# Patient Record
Sex: Female | Born: 1950 | Race: White | Hispanic: No | Smoking: Never smoker
Health system: Southern US, Community
[De-identification: ages and names within clinical notes are randomized; demographics above are authoritative.]

## PROBLEM LIST (undated history)

## (undated) DIAGNOSIS — N809 Endometriosis, unspecified: Secondary | ICD-10-CM

## (undated) DIAGNOSIS — R7303 Prediabetes: Secondary | ICD-10-CM

## (undated) HISTORY — PX: TRIGGER FINGER RELEASE: SHX641

## (undated) HISTORY — PX: ABLATION ON ENDOMETRIOSIS: SHX5787

---

## 1998-01-30 ENCOUNTER — Other Ambulatory Visit: Admission: RE | Admit: 1998-01-30 | Discharge: 1998-01-30 | Payer: Self-pay | Admitting: *Deleted

## 1998-09-02 ENCOUNTER — Ambulatory Visit (HOSPITAL_COMMUNITY): Admission: RE | Admit: 1998-09-02 | Discharge: 1998-09-02 | Payer: Self-pay | Admitting: *Deleted

## 2000-01-12 ENCOUNTER — Other Ambulatory Visit: Admission: RE | Admit: 2000-01-12 | Discharge: 2000-01-12 | Payer: Self-pay | Admitting: *Deleted

## 2001-03-02 ENCOUNTER — Other Ambulatory Visit: Admission: RE | Admit: 2001-03-02 | Discharge: 2001-03-02 | Payer: Self-pay | Admitting: *Deleted

## 2002-04-06 ENCOUNTER — Other Ambulatory Visit: Admission: RE | Admit: 2002-04-06 | Discharge: 2002-04-06 | Payer: Self-pay | Admitting: *Deleted

## 2003-04-09 ENCOUNTER — Other Ambulatory Visit: Admission: RE | Admit: 2003-04-09 | Discharge: 2003-04-09 | Payer: Self-pay | Admitting: *Deleted

## 2004-04-08 ENCOUNTER — Other Ambulatory Visit: Admission: RE | Admit: 2004-04-08 | Discharge: 2004-04-08 | Payer: Self-pay | Admitting: *Deleted

## 2006-03-11 ENCOUNTER — Other Ambulatory Visit: Admission: RE | Admit: 2006-03-11 | Discharge: 2006-03-11 | Payer: Self-pay | Admitting: *Deleted

## 2007-06-30 ENCOUNTER — Other Ambulatory Visit: Admission: RE | Admit: 2007-06-30 | Discharge: 2007-06-30 | Payer: Self-pay | Admitting: *Deleted

## 2008-07-10 ENCOUNTER — Other Ambulatory Visit: Admission: RE | Admit: 2008-07-10 | Discharge: 2008-07-10 | Payer: Self-pay | Admitting: Gynecology

## 2009-02-28 ENCOUNTER — Encounter: Admission: RE | Admit: 2009-02-28 | Discharge: 2009-04-02 | Payer: Self-pay | Admitting: Sports Medicine

## 2018-04-09 ENCOUNTER — Emergency Department: Payer: BLUE CROSS/BLUE SHIELD

## 2018-04-09 ENCOUNTER — Other Ambulatory Visit: Payer: Self-pay

## 2018-04-09 ENCOUNTER — Emergency Department
Admission: EM | Admit: 2018-04-09 | Discharge: 2018-04-09 | Disposition: A | Discharge status: Home / Self Care | Payer: BLUE CROSS/BLUE SHIELD | Attending: Emergency Medicine | Admitting: Emergency Medicine

## 2018-04-09 ENCOUNTER — Encounter: Payer: Self-pay | Admitting: Emergency Medicine

## 2018-04-09 DIAGNOSIS — Z87891 Personal history of nicotine dependence: Secondary | ICD-10-CM | POA: Insufficient documentation

## 2018-04-09 DIAGNOSIS — Z7982 Long term (current) use of aspirin: Secondary | ICD-10-CM | POA: Insufficient documentation

## 2018-04-09 DIAGNOSIS — M25552 Pain in left hip: Secondary | ICD-10-CM | POA: Insufficient documentation

## 2018-04-09 MED ORDER — MELOXICAM 15 MG PO TABS: 15.0000 mg | ORAL_TABLET | Freq: Every day | ORAL | 1 refills | Status: AC
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
## 2020-05-20 ENCOUNTER — Other Ambulatory Visit: Payer: Self-pay

## 2020-05-20 ENCOUNTER — Emergency Department: Payer: 59

## 2020-05-20 ENCOUNTER — Emergency Department
Admission: EM | Admit: 2020-05-20 | Discharge: 2020-05-20 | Disposition: A | Discharge status: Home / Self Care | Payer: 59 | Attending: Emergency Medicine | Admitting: Emergency Medicine

## 2020-05-20 ENCOUNTER — Encounter: Payer: Self-pay | Admitting: Emergency Medicine

## 2020-05-20 DIAGNOSIS — R197 Diarrhea, unspecified: Secondary | ICD-10-CM | POA: Diagnosis present

## 2020-05-20 DIAGNOSIS — Z7982 Long term (current) use of aspirin: Secondary | ICD-10-CM | POA: Insufficient documentation

## 2020-05-20 DIAGNOSIS — U071 COVID-19: Secondary | ICD-10-CM | POA: Diagnosis not present

## 2020-05-20 DIAGNOSIS — E860 Dehydration: Secondary | ICD-10-CM

## 2020-05-20 HISTORY — DX: Prediabetes: R73.03

## 2020-05-20 HISTORY — DX: Endometriosis, unspecified: N80.9

## 2020-05-20 LAB — COMPREHENSIVE METABOLIC PANEL
ALT: 23 U/L (ref 0–44)
AST: 34 U/L (ref 15–41)
Albumin: 4.1 g/dL (ref 3.5–5.0)
Alkaline Phosphatase: 55 U/L (ref 38–126)
Anion gap: 13 (ref 5–15)
BUN: 20 mg/dL (ref 8–23)
CO2: 24 mmol/L (ref 22–32)
Calcium: 9.2 mg/dL (ref 8.9–10.3)
Chloride: 102 mmol/L (ref 98–111)
Creatinine, Ser: 1.04 mg/dL — ABNORMAL HIGH (ref 0.44–1.00)
GFR calc Af Amer: >60 mL/min (ref >60)
GFR calc non Af Amer: 56 mL/min — ABNORMAL LOW (ref >60)
Glucose, Bld: 120 mg/dL — ABNORMAL HIGH (ref 70–99)
Potassium: 3.8 mmol/L (ref 3.5–5.1)
Sodium: 139 mmol/L (ref 135–145)
Total Bilirubin: 0.7 mg/dL (ref 0.3–1.2)
Total Protein: 7.8 g/dL (ref 6.5–8.1)

## 2020-05-20 LAB — URINALYSIS, COMPLETE (UACMP) WITH MICROSCOPIC
Bacteria, UA: NONE SEEN
Bilirubin Urine: NEGATIVE
Glucose, UA: NEGATIVE mg/dL
Hgb urine dipstick: NEGATIVE
Ketones, ur: 5 mg/dL — AB
Leukocytes,Ua: NEGATIVE
Nitrite: NEGATIVE
Protein, ur: 30 mg/dL — AB
Specific Gravity, Urine: 1.024 (ref 1.005–1.030)
pH: 5 (ref 5.0–8.0)

## 2020-05-20 LAB — CBC
HCT: 46.6 % — ABNORMAL HIGH (ref 36.0–46.0)
Hemoglobin: 15.7 g/dL — ABNORMAL HIGH (ref 12.0–15.0)
MCH: 28.1 pg (ref 26.0–34.0)
MCHC: 33.7 g/dL (ref 30.0–36.0)
MCV: 83.5 fL (ref 80.0–100.0)
Platelets: 157 10*3/uL (ref 150–400)
RBC: 5.58 MIL/uL — ABNORMAL HIGH (ref 3.87–5.11)
RDW: 14.2 % (ref 11.5–15.5)
WBC: 6.1 10*3/uL (ref 4.0–10.5)
nRBC: 0 % (ref 0.0–0.2)

## 2020-05-20 LAB — LIPASE, BLOOD: Lipase: 44 U/L (ref 11–51)

## 2020-05-20 LAB — SARS CORONAVIRUS 2 BY RT PCR (HOSPITAL ORDER, PERFORMED IN ~~LOC~~ HOSPITAL LAB): SARS Coronavirus 2: POSITIVE — AB

## 2020-05-20 MED ORDER — ONDANSETRON HCL 4 MG/2ML IJ SOLN
4.0000 mg | Freq: Once | INTRAMUSCULAR | Status: AC
  Administered 2020-05-20: 4 mg via INTRAVENOUS

## 2020-05-20 MED ORDER — SODIUM CHLORIDE 0.9 % IV BOLUS
1000.0000 mL | Freq: Once | INTRAVENOUS | Status: AC
  Administered 2020-05-20: 1000 mL via INTRAVENOUS

## 2020-05-20 MED ORDER — ONDANSETRON 4 MG PO TBDP: 4.0000 mg | ORAL_TABLET | Freq: Three times a day (TID) | ORAL | 0 refills | Status: AC | PRN

## 2020-05-20 NOTE — ED Provider Notes (Signed)
[facility] Emergency Department Provider Note  ____________________________________________   First MD Initiated Contact with Patient 05/20/20 1238     (approximate)  I have reviewed the triage vital signs and the nursing notes.   HISTORY  Chief Complaint Emesis and Diarrhea    HPI Sabrina Vega is a 69 y.o. female presents emergency department complaining of fever, cough, vomiting diarrhea since Saturday.  Patient is not immunized for Covid.  She states that she is afraid the diarrhea will run down her legs.  However she has not had any vomiting or diarrhea since she has been here in the ED.  She denies abdominal pain other than cramping with diarrhea.  Patient states she is prediabetic and has an appointment with her doctor.  She denies chest pain or shortness of breath at this time    Past Medical History:  Diagnosis Date  . Endometriosis   . Prediabetes     There are no problems to display for this patient.   Past Surgical History:  Procedure Laterality Date  . ABLATION ON ENDOMETRIOSIS    . TRIGGER FINGER RELEASE Left     Prior to Admission medications   Medication Sig Start Date End Date Taking? Authorizing Provider  aspirin 81 MG chewable tablet Chew 81 mg by mouth daily.    [provider]  ondansetron (ZOFRAN-ODT) 4 MG disintegrating tablet Take 1 tablet (4 mg total) by mouth every 8 (eight) hours as needed. 05/20/20   Faythe Ghee, PA-C    Allergies Penicillins and Sulfa antibiotics  No family history on file.  Social History Social History   Tobacco Use  . Smoking status: Never Smoker  . Smokeless tobacco: Never Used  Substance Use Topics  . Alcohol use: Not Currently  . Drug use: Not Currently    Review of Systems  Constitutional: Positive fever/chills Eyes: No visual changes. ENT: No sore throat. Respiratory: Positive cough Cardiovascular: Denies chest pain Gastrointestinal: Denies abdominal pain,  positive nausea vomiting diarrhea Genitourinary: Negative for dysuria. Musculoskeletal: Negative for back pain. Skin: Negative for rash. Psychiatric: no mood changes,     ____________________________________________   PHYSICAL EXAM:  VITAL SIGNS: ED Triage Vitals  Enc Vitals Group     BP 05/20/20 1101 117/69     Pulse Rate 05/20/20 1101 (!) 101     Resp 05/20/20 1101 19     Temp 05/20/20 1101 98.9 F (37.2 C)     Temp Source 05/20/20 1101 Oral     SpO2 05/20/20 1101 100 %     Weight 05/20/20 1110 145 lb (65.8 kg)     Height 05/20/20 1110 5\' 3"  (1.6 m)     Head Circumference --      Peak Flow --      Pain Score 05/20/20 1110 0     Pain Loc --      Pain Edu? --      Excl. in GC? --     Constitutional: Alert and oriented. Well appearing and in no acute distress. Eyes: Conjunctivae are normal.  Head: Atraumatic. Nose: No congestion/rhinnorhea. Mouth/Throat: Mucous membranes are moist.   Neck:  supple no lymphadenopathy noted Cardiovascular: Normal rate, regular rhythm. Heart sounds are normal Respiratory: Normal respiratory effort.  No retractions, lungs c t a, patient has a dry cough Abd: soft nontender bs normal all 4 quad GU: deferred Musculoskeletal: FROM all extremities, warm and well perfused Neurologic:  Normal speech and language.  Skin:  Skin is warm,  dry and intact. No rash noted. Psychiatric: Mood and affect are normal. Speech and behavior are normal.  ____________________________________________   LABS (all labs ordered are listed, but only abnormal results are displayed)  Labs Reviewed  SARS CORONAVIRUS 2 BY RT PCR (HOSPITAL ORDER, PERFORMED IN [facility]) - Abnormal; Notable for the following components:      Result Value   SARS Coronavirus 2 POSITIVE (*)    All other components within normal limits  COMPREHENSIVE METABOLIC PANEL - Abnormal; Notable for the following components:   Glucose, Bld 120 (*)    Creatinine, Ser 1.04 (*)     GFR calc non Af Amer 56 (*)    All other components within normal limits  CBC - Abnormal; Notable for the following components:   RBC 5.58 (*)    Hemoglobin 15.7 (*)    HCT 46.6 (*)    All other components within normal limits  URINALYSIS, COMPLETE (UACMP) WITH MICROSCOPIC - Abnormal; Notable for the following components:   Color, Urine AMBER (*)    APPearance CLOUDY (*)    Ketones, ur 5 (*)    Protein, ur 30 (*)    All other components within normal limits  LIPASE, BLOOD   ____________________________________________   ____________________________________________  RADIOLOGY  Chest x-ray is normal  ____________________________________________   PROCEDURES  Procedure(s) performed: No  Procedures    ____________________________________________   INITIAL IMPRESSION / ASSESSMENT AND PLAN / ED COURSE  Pertinent labs & imaging results that were available during my care of the patient were reviewed by me and considered in my medical decision making (see chart for details).   Patient is a 69 year old female who is unvaccinated for Covid presents emergency department Covid-like symptoms.  See HPI  Physical exam shows patient to appear stable.  Exam is unremarkable other than a dry cough  DDx: Covid, gastroenteritis, community-acquired pneumonia  CBC has elevated H&H most likely due to dehydration, comprehensive metabolic panel has elevated glucose of 120, creatinine is 1.04, however the patient's BUN is normal, lipase is normal, urinalysis is not collected at this time as patient's been unable to urinate, Covid swab pending  Covid test is positive. Gave the patient 1 L normal saline here in the ED.  She is to follow-up with the infusion center to see if she qualifies for remdesivir infusions.  She states she understands.  She is to quarantine for 14 days.  Is discharged stable condition.    Sabrina Vega was evaluated in Emergency Department on 05/20/2020 for the symptoms  described in the history of present illness. She was evaluated in the context of the global COVID-19 pandemic, which necessitated consideration that the patient might be at risk for infection with the SARS-CoV-2 virus that causes COVID-19. Institutional protocols and algorithms that pertain to the evaluation of patients at risk for COVID-19 are in a state of rapid change based on information released by regulatory bodies including the CDC and federal and state organizations. These policies and algorithms were followed during the patient's care in the ED.    As part of my medical decision making, I reviewed the following data within the electronic MEDICAL RECORD NUMBER Nursing notes reviewed and incorporated, Labs reviewed , Old chart reviewed, Radiograph reviewed , Notes from prior ED visits and [place] Controlled Substance Database  ____________________________________________   FINAL CLINICAL IMPRESSION(S) / ED DIAGNOSES  Final diagnoses:  Dehydration  COVID-19      NEW MEDICATIONS STARTED DURING THIS VISIT:  Discharge Medication  List as of 05/20/2020  3:56 PM    START taking these medications   Details  ondansetron (ZOFRAN-ODT) 4 MG disintegrating tablet Take 1 tablet (4 mg total) by mouth every 8 (eight) hours as needed., Starting Mon 05/20/2020, Print         Note:  This document was prepared using Dragon voice recognition software and may include unintentional dictation errors.    Faythe Ghee, PA-C 05/20/20 Lacie Scotts, MD 05/21/20 [phone]

## 2020-05-20 NOTE — ED Triage Notes (Signed)
Pt presents to ED via POV with c/o N/V/D. Pt states over the weekend had "constant nausea, vomiting, and diarrhea". Pt states today no vomiting, c/o nausea, states has had several episodes of diarrhea. Pt states is able to tolerate PO at this time but feels like "it comes right out".

## 2020-05-20 NOTE — Discharge Instructions (Signed)
Follow-up with your regular doctor as needed. Follow-up with the Covid infusion clinic to receive remdesivir infusions.  Please call to see if you qualify. Take over-the-counter Imodium A-D as needed for diarrhea Zofran for vomiting as needed

## 2020-05-20 NOTE — ED Notes (Addendum)
Pt ambulatory from lobby with steady gait. Pt reports fever, cough, vomiting and diarrhea since Saturday. Pt in NAD, skin warm and dry.

## 2020-05-26 ENCOUNTER — Emergency Department (HOSPITAL_COMMUNITY): Payer: 59

## 2020-05-26 ENCOUNTER — Encounter (HOSPITAL_COMMUNITY): Payer: Self-pay | Admitting: *Deleted

## 2020-05-26 ENCOUNTER — Other Ambulatory Visit: Payer: Self-pay

## 2020-05-26 ENCOUNTER — Emergency Department (HOSPITAL_COMMUNITY)
Admission: EM | Admit: 2020-05-26 | Discharge: 2020-05-27 | Disposition: A | Discharge status: Home / Self Care | Payer: 59 | Attending: Emergency Medicine | Admitting: Emergency Medicine

## 2020-05-26 DIAGNOSIS — R0602 Shortness of breath: Secondary | ICD-10-CM | POA: Diagnosis present

## 2020-05-26 DIAGNOSIS — Z7982 Long term (current) use of aspirin: Secondary | ICD-10-CM | POA: Insufficient documentation

## 2020-05-26 DIAGNOSIS — U071 COVID-19: Secondary | ICD-10-CM | POA: Insufficient documentation

## 2020-05-26 LAB — BASIC METABOLIC PANEL
Anion gap: 13 (ref 5–15)
BUN: 12 mg/dL (ref 8–23)
CO2: 23 mmol/L (ref 22–32)
Calcium: 8.5 mg/dL — ABNORMAL LOW (ref 8.9–10.3)
Chloride: 99 mmol/L (ref 98–111)
Creatinine, Ser: 0.74 mg/dL (ref 0.44–1.00)
GFR calc Af Amer: >60 mL/min (ref >60)
GFR calc non Af Amer: >60 mL/min (ref >60)
Glucose, Bld: 194 mg/dL — ABNORMAL HIGH (ref 70–99)
Potassium: 3.5 mmol/L (ref 3.5–5.1)
Sodium: 135 mmol/L (ref 135–145)

## 2020-05-26 LAB — CBC
HCT: 43 % (ref 36.0–46.0)
Hemoglobin: 14 g/dL (ref 12.0–15.0)
MCH: 27.5 pg (ref 26.0–34.0)
MCHC: 32.6 g/dL (ref 30.0–36.0)
MCV: 84.5 fL (ref 80.0–100.0)
Platelets: 243 10*3/uL (ref 150–400)
RBC: 5.09 MIL/uL (ref 3.87–5.11)
RDW: 13.2 % (ref 11.5–15.5)
WBC: 7.1 10*3/uL (ref 4.0–10.5)
nRBC: 0 % (ref 0.0–0.2)

## 2020-05-26 NOTE — ED Triage Notes (Signed)
Pt brought in by ems due to intermittent sob.  Pt was dx with covid on Monday.  Spo2 94% on RA for ems.

## 2020-05-26 NOTE — ED Triage Notes (Signed)
Tested positive for covid Monday.  Pt is unvaccinated.  Pt continues to feel unwell

## 2020-05-27 MED ORDER — FAMOTIDINE IN NACL 20-0.9 MG/50ML-% IV SOLN: 20.0000 mg | Freq: Once | INTRAVENOUS | Status: DC | PRN

## 2020-05-27 MED ORDER — SODIUM CHLORIDE 0.9 % IV SOLN: INTRAVENOUS | Status: DC | PRN

## 2020-05-27 MED ORDER — EPINEPHRINE 0.3 MG/0.3ML IJ SOAJ: 0.3000 mg | Freq: Once | INTRAMUSCULAR | Status: DC | PRN

## 2020-05-27 MED ORDER — DIPHENHYDRAMINE HCL 50 MG/ML IJ SOLN: 50.0000 mg | Freq: Once | INTRAMUSCULAR | Status: DC | PRN

## 2020-05-27 MED ORDER — SODIUM CHLORIDE 0.9 % IV SOLN
1200.0000 mg | Freq: Once | INTRAVENOUS | Status: AC
  Administered 2020-05-27: 1200 mg via INTRAVENOUS
  Filled 2020-05-27: qty 1200

## 2020-05-27 MED ORDER — ACETAMINOPHEN 325 MG PO TABS
650.0000 mg | ORAL_TABLET | Freq: Once | ORAL | Status: AC
  Administered 2020-05-27: 650 mg via ORAL
  Filled 2020-05-27: qty 2

## 2020-05-27 MED ORDER — METHYLPREDNISOLONE SODIUM SUCC 125 MG IJ SOLR: 125.0000 mg | Freq: Once | INTRAMUSCULAR | Status: DC | PRN

## 2020-05-27 MED ORDER — ALBUTEROL SULFATE HFA 108 (90 BASE) MCG/ACT IN AERS: 2.0000 | INHALATION_SPRAY | Freq: Once | RESPIRATORY_TRACT | Status: DC | PRN

## 2020-05-27 NOTE — ED Notes (Signed)
Patient ambulated with cane with pulse oximetry lowest 93% RA but maintained above 95% on RA.

## 2020-05-27 NOTE — Discharge Instructions (Signed)
Take tylenol for fever, headaches, body aches. Drink plenty of fluids to prevent dehydration. You may continue to use other over-the-counter remedies for symptom control, if desired. Return for new or concerning symptoms such as worsening shortness of breath, coughing up blood, persistent vomiting, loss of consciousness.

## 2020-05-27 NOTE — ED Provider Notes (Addendum)
[facility] EMERGENCY DEPARTMENT Provider Note   CSN: [id number] Arrival date & time: 05/26/20  1520     History Chief Complaint  Patient presents with   Shortness of Breath    Sabrina Vega is a 69 y.o. female.  Sabrina Vega is a 69 year old female with a PMH of prediabetes, arthritis, and glaucoma who presents with a chief complaint of SOB and general malaise. She was seen at [facility] on 05/20/20 with complaints of fever, cough, vomiting, and diarrhea and was positive for COVID-19. Her symptoms started on 05/18/20. Patient states that she is feeling worse since being seen at [facility]. She continues to have a productive cough with white sputum but denies hemoptysis. She has developed SOB that is worse when moving or trying to get up and use the bathroom. Patient endorses continued vomiting and diarrhea. She has vomited once since being seen at [facility] and denies using prescribed Zofran because it makes her feel dry. She has diarrhea 2-3 times a day without fecal incontinence and is taking one Imodium daily. She is drinking fluids including water, sprite, and Gatorade but has had little appetite. Patient has not been vaccinated for COVID-19.  The history is provided by the patient. No language interpreter was used.       Past Medical History:  Diagnosis Date   Endometriosis    Prediabetes     There are no problems to display for this patient.   Past Surgical History:  Procedure Laterality Date   ABLATION ON ENDOMETRIOSIS     TRIGGER FINGER RELEASE Left      OB History   No obstetric history on file.     No family history on file.  Social History   Tobacco Use   Smoking status: Never Smoker   Smokeless tobacco: Never Used  Substance Use Topics   Alcohol use: Not Currently   Drug use: Not Currently    Home Medications Prior to Admission medications   Medication Sig Start Date End Date Taking? Authorizing Provider  aspirin 81 MG chewable tablet  Chew 81 mg by mouth daily.    [provider]  ondansetron (ZOFRAN-ODT) 4 MG disintegrating tablet Take 1 tablet (4 mg total) by mouth every 8 (eight) hours as needed. 05/20/20   Faythe Ghee, PA-C    Allergies    Penicillins and Sulfa antibiotics  Review of Systems   Review of Systems  Ten systems reviewed and are negative for acute change, except as noted in the HPI.    Physical Exam Updated Vital Signs BP 140/62    Pulse 77    Temp 99.3 F (37.4 C) (Oral)    Resp (!) 28    SpO2 94%   Physical Exam Vitals and nursing note reviewed.  Constitutional:      General: She is not in acute distress.    Appearance: She is well-developed. She is not diaphoretic.     Comments: Nontoxic appearing  HENT:     Head: Normocephalic and atraumatic.  Eyes:     General: No scleral icterus.    Conjunctiva/sclera: Conjunctivae normal.  Cardiovascular:     Rate and Rhythm: Normal rate and regular rhythm.     Pulses: Normal pulses.  Pulmonary:     Effort: Pulmonary effort is normal. No respiratory distress.     Comments: Respirations even and unlabored. SpO2 97% on room air. Musculoskeletal:        General: Normal range of motion.  Cervical back: Normal range of motion.  Skin:    General: Skin is warm and dry.     Coloration: Skin is not pale.     Findings: No erythema or rash.  Neurological:     Mental Status: She is alert and oriented to person, place, and time.  Psychiatric:        Behavior: Behavior normal.     ED Results / Procedures / Treatments   Labs (all labs ordered are listed, but only abnormal results are displayed) Labs Reviewed  BASIC METABOLIC PANEL - Abnormal; Notable for the following components:      Result Value   Glucose, Bld 194 (*)    Calcium 8.5 (*)    All other components within normal limits  CBC    EKG EKG Interpretation  Date/Time:  Monday May 27 2020 00:18:25 EDT Ventricular Rate:  93 PR Interval:  156 QRS Duration: 90 QT  Interval:  339 QTC Calculation: 422 R Axis:   -36 Text Interpretation: Sinus rhythm Left axis deviation Anterolateral infarct, age indeterminate no STEMI Confirmed by Glynn Octave [phone]) on 05/27/2020 12:24:12 AM   Radiology DG Chest Portable 1 View  Result Date: 05/26/2020 CLINICAL DATA:  COVID. EXAM: PORTABLE CHEST 1 VIEW COMPARISON:  Radiograph 05/20/2020 FINDINGS: Lower lung volumes from prior exam. Patchy heterogeneous bilateral airspace opacities in a mid-lower lung zone predominant distribution. Normal heart size and mediastinal contours. No pleural effusion or pneumothorax. External artifact from clothing. IMPRESSION: Patchy heterogeneous bilateral airspace opacities, consistent with COVID-19 pneumonia. Progression from exam 6 days ago. Electronically Signed   By: Narda Rutherford M.D.   On: 05/26/2020 20:31    Procedures .Critical Care Performed by: Antony Madura, PA-C Authorized by: Antony Madura, PA-C   Critical care provider statement:    Critical care time (minutes):  45   Critical care was time spent personally by me on the following activities:  Discussions with consultants, evaluation of patient's response to treatment, examination of patient, ordering and performing treatments and interventions, ordering and review of laboratory studies, ordering and review of radiographic studies, pulse oximetry, re-evaluation of patient's condition, obtaining history from patient or surrogate and review of old charts   (including critical care time)  Medications Ordered in ED Medications  0.9 %  sodium chloride infusion (has no administration in time range)  diphenhydrAMINE (BENADRYL) injection 50 mg (has no administration in time range)  famotidine (PEPCID) IVPB 20 mg premix (has no administration in time range)  methylPREDNISolone sodium succinate (SOLU-MEDROL) 125 mg/2 mL injection 125 mg (has no administration in time range)  albuterol (VENTOLIN HFA) 108 (90 Base) MCG/ACT inhaler  2 puff (has no administration in time range)  EPINEPHrine (EPI-PEN) injection 0.3 mg (has no administration in time range)  acetaminophen (TYLENOL) tablet 650 mg (650 mg Oral Given 05/27/20 0037)  casirivimab-imdevimab (REGEN-COV) 1,200 mg in sodium chloride 0.9 % 110 mL IVPB (0 mg Intravenous Stopped 05/27/20 0252)    ED Course  I have reviewed the triage vital signs and the nursing notes.  Pertinent labs & imaging results that were available during my care of the patient were reviewed by me and considered in my medical decision making (see chart for details).  Clinical Course as of May 27 410  Mon May 27, 2020  4008 Patient ambulated in the hallway with RN. Sats dropped to 93% for a second, but otherwise maintained at or above 95% on room air.   [KH]  0205 Discussed option for monoclonal antibody infusion  with patient given older age and mild symptoms.  She has agreed to proceed with infusion today.  Anticipate discharge once infusion completed if no adverse reaction or clinical decompensation.   [KH]  0327 Infusion completed. VSS. If no change by 0400, patient appropriate for discharge.   [KH]  0411 Patient sleeping on reassessment. Feels fine when awake. VSS. Discharged with return precautions.   [KH]    Clinical Course User Index [KH] Darylene Price   MDM Rules/Calculators/A&P                          69 year old female presenting to the emergency department with complaints of persistent symptoms of COVID-19 infection.  Initially diagnosed 1 week ago at [facility]  While evidence of slight disease progression on chest x-ray, labs reassuring.  Vital stable and no hypoxia with ambulation.  Given age, prediabetes, patient given the option to proceed with monoclonal antibody infusion.  She tolerated this treatment well without clinical decompensation.  Have provided information on continued outpatient supportive care.  Encouraged follow-up with a primary doctor.  Return precautions  discussed and provided. Patient discharged in stable condition with no unaddressed concerns.  Sabrina Vega was evaluated in Emergency Department on 05/27/2020 for the symptoms described in the history of present illness. She was evaluated in the context of the global COVID-19 pandemic, which necessitated consideration that the patient might be at risk for infection with the SARS-CoV-2 virus that causes COVID-19. Institutional protocols and algorithms that pertain to the evaluation of patients at risk for COVID-19 are in a state of rapid change based on information released by regulatory bodies including the CDC and federal and state organizations. These policies and algorithms were followed during the patient's care in the ED.   Final Clinical Impression(s) / ED Diagnoses Final diagnoses:  COVID-19 virus infection    Rx / DC Orders ED Discharge Orders    None       Antony Madura, PA-C 05/27/20 0413    Antony Madura, PA-C 05/27/20 0414    Glynn Octave, MD 05/31/20 [phone]

## 2020-08-09 ENCOUNTER — Other Ambulatory Visit: Payer: Self-pay | Admitting: Obstetrics & Gynecology

## 2020-08-09 ENCOUNTER — Other Ambulatory Visit: Payer: Self-pay | Admitting: Diagnostic Radiology

## 2020-08-09 DIAGNOSIS — N63 Unspecified lump in unspecified breast: Secondary | ICD-10-CM

## 2020-08-27 ENCOUNTER — Other Ambulatory Visit: Payer: Self-pay | Admitting: Internal Medicine

## 2020-08-27 ENCOUNTER — Ambulatory Visit: Payer: 59 | Attending: Internal Medicine

## 2020-08-27 DIAGNOSIS — Z23 Encounter for immunization: Secondary | ICD-10-CM

## 2020-08-27 NOTE — Progress Notes (Signed)
   Covid-19 Vaccination Clinic  Name:  Sabrina Vega    MRN: [id number] DOB: [birth year]  08/27/2020  [name] was observed post Covid-19 immunization for 15 minutes without incident. She was provided with Vaccine Information Sheet and instruction to access the V-Safe system.   [name] was instructed to call 911 with any severe reactions post vaccine: Marland Kitchen Difficulty breathing  . Swelling of face and throat  . A fast heartbeat  . A bad rash all over body  . Dizziness and weakness   Immunizations Administered    Name Date Dose VIS Date Route   Pfizer COVID-19 Vaccine 08/27/2020  9:06 AM 0.3 mL 07/17/2020 Intramuscular   Manufacturer: [employer], [employer]   Lot: [id number]   NDC: [address]-1

## 2020-09-17 ENCOUNTER — Ambulatory Visit: Payer: 59 | Attending: Internal Medicine

## 2020-09-17 ENCOUNTER — Other Ambulatory Visit: Payer: Self-pay | Admitting: Internal Medicine

## 2020-09-17 DIAGNOSIS — Z23 Encounter for immunization: Secondary | ICD-10-CM

## 2020-09-17 NOTE — Progress Notes (Signed)
   Covid-19 Vaccination Clinic  Name:  NORENA BRATTON    MRN: [id number] DOB: [birth year]  09/17/2020  Ms. Mecham was observed post Covid-19 immunization for 15 minutes without incident. She was provided with Vaccine Information Sheet and instruction to access the V-Safe system.   Ms. Daza was instructed to call 911 with any severe reactions post vaccine: Marland Kitchen Difficulty breathing  . Swelling of face and throat  . A fast heartbeat  . A bad rash all over body  . Dizziness and weakness   Immunizations Administered    Name Date Dose VIS Date Route   Pfizer COVID-19 Vaccine 09/17/2020  3:00 PM 0.3 mL 07/17/2020 Intramuscular   Manufacturer: [employer], [employer]   Lot: [id number]   NDC: [address]-6

## 2021-10-02 ENCOUNTER — Ambulatory Visit: Payer: 59 | Admitting: Dermatology

## 2022-06-01 IMAGING — DX DG CHEST 1V PORT
1 series · 1 of 1 positions shown · non-contrast
Comparison: Radiograph 05/20/2020

CLINICAL DATA: COVID.

EXAM:
PORTABLE CHEST 1 VIEW

[chest ap]
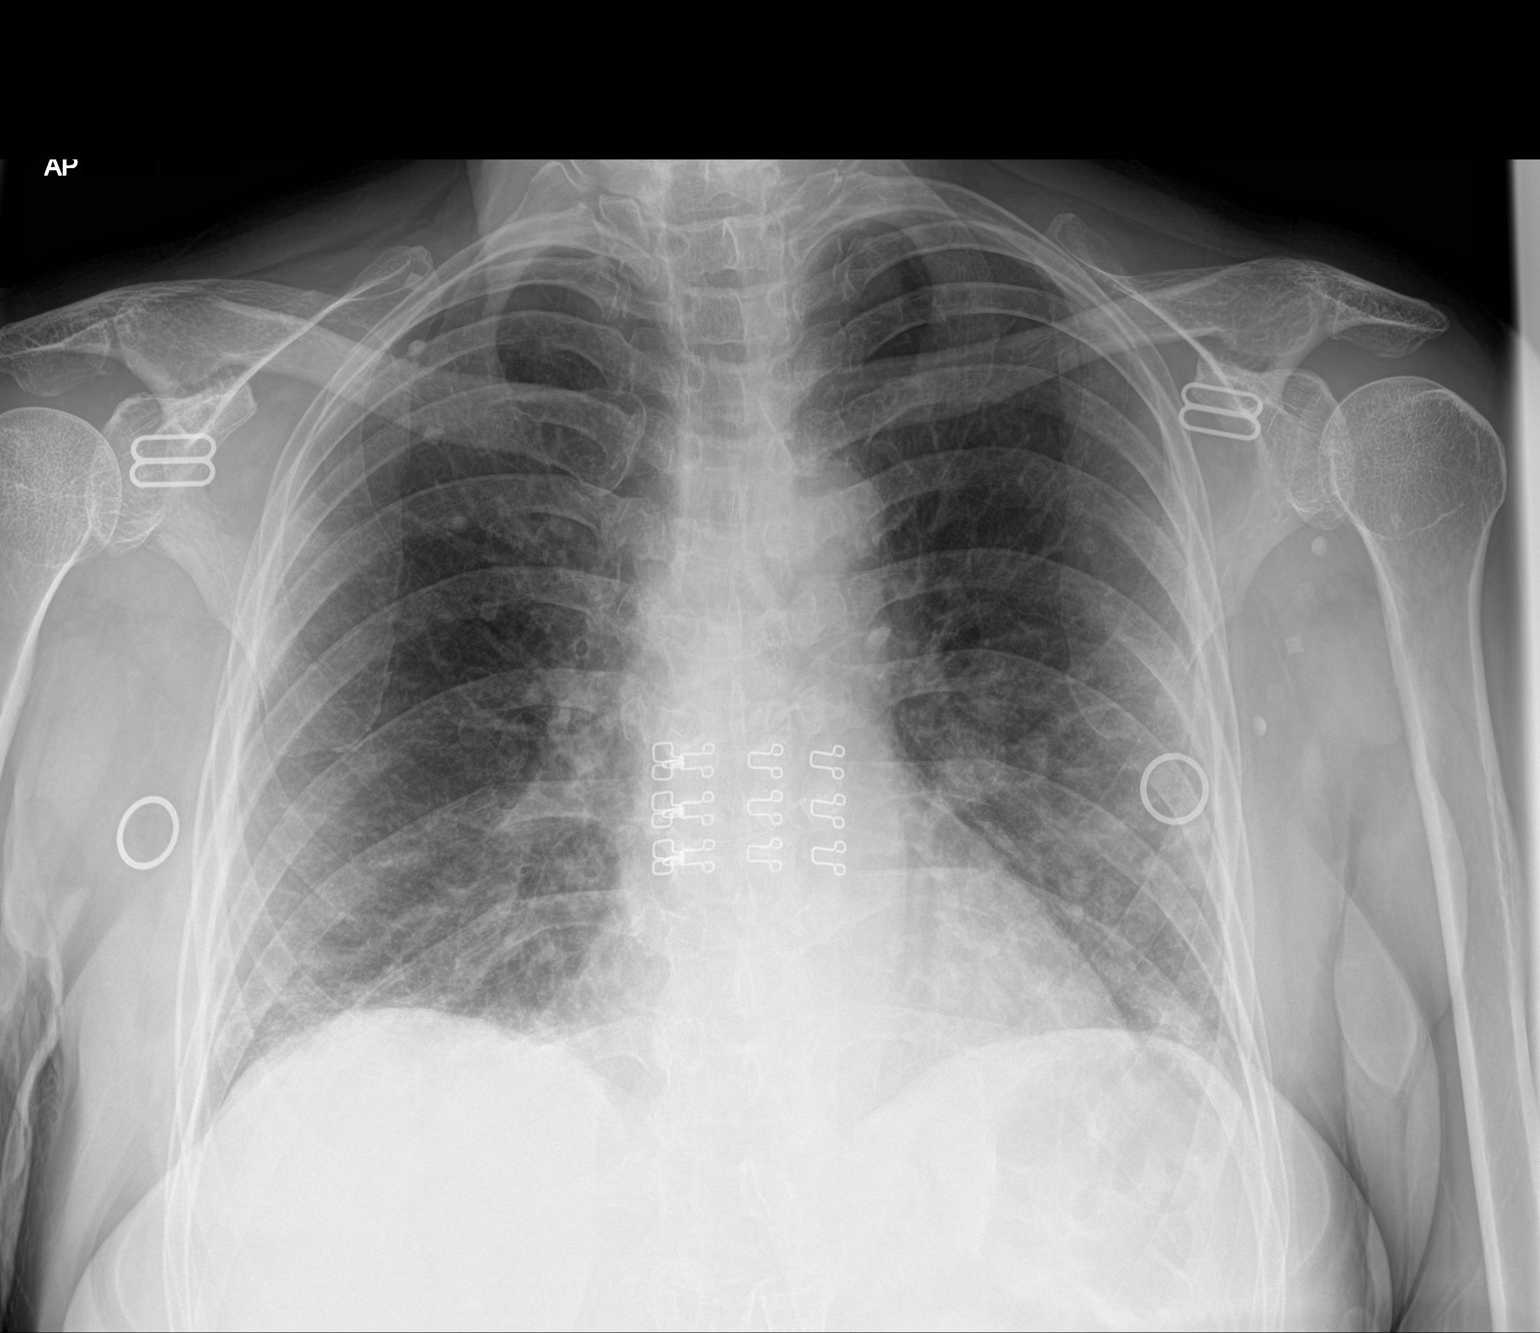

[1 of 1 positions shown; findings below may reference images not displayed]

FINDINGS: Lower lung volumes from prior exam. Patchy heterogeneous bilateral
airspace opacities in a mid-lower lung zone predominant
distribution. Normal heart size and mediastinal contours. No pleural
effusion or pneumothorax. External artifact from clothing.
IMPRESSION: Patchy heterogeneous bilateral airspace opacities, consistent with
CXG4A-SN pneumonia. Progression from exam 6 days ago.
